# Patient Record
Sex: Female | Born: 1955 | Race: Black or African American | Hispanic: No | Marital: Married | Smoking: Never smoker
Health system: Southern US, Community
[De-identification: ages and names within clinical notes are randomized; demographics above are authoritative.]

## PROBLEM LIST (undated history)

## (undated) DIAGNOSIS — I10 Essential (primary) hypertension: Secondary | ICD-10-CM

---

## 2021-10-09 ENCOUNTER — Other Ambulatory Visit: Payer: Self-pay

## 2021-10-09 ENCOUNTER — Emergency Department
Admission: EM | Admit: 2021-10-09 | Discharge: 2021-10-09 | Disposition: A | Discharge status: Home / Self Care | Payer: BC Managed Care – PPO | Attending: Emergency Medicine | Admitting: Emergency Medicine

## 2021-10-09 ENCOUNTER — Emergency Department: Payer: BC Managed Care – PPO

## 2021-10-09 DIAGNOSIS — Z79899 Other long term (current) drug therapy: Secondary | ICD-10-CM | POA: Diagnosis not present

## 2021-10-09 DIAGNOSIS — I10 Essential (primary) hypertension: Secondary | ICD-10-CM | POA: Diagnosis present

## 2021-10-09 HISTORY — DX: Essential (primary) hypertension: I10

## 2021-10-09 LAB — CBC
HCT: 44.9 % (ref 36.0–46.0)
Hemoglobin: 15 g/dL (ref 12.0–15.0)
MCH: 30.2 pg (ref 26.0–34.0)
MCHC: 33.4 g/dL (ref 30.0–36.0)
MCV: 90.3 fL (ref 80.0–100.0)
Platelets: 196 10*3/uL (ref 150–400)
RBC: 4.97 MIL/uL (ref 3.87–5.11)
RDW: 12.3 % (ref 11.5–15.5)
WBC: 3.9 10*3/uL — ABNORMAL LOW (ref 4.0–10.5)
nRBC: 0 % (ref 0.0–0.2)

## 2021-10-09 LAB — URINALYSIS, ROUTINE W REFLEX MICROSCOPIC
Bilirubin Urine: NEGATIVE
Glucose, UA: NEGATIVE mg/dL
Hgb urine dipstick: NEGATIVE
Ketones, ur: NEGATIVE mg/dL
Leukocytes,Ua: NEGATIVE
Nitrite: NEGATIVE
Protein, ur: NEGATIVE mg/dL
Specific Gravity, Urine: 1.01 (ref 1.005–1.030)
pH: 8 (ref 5.0–8.0)

## 2021-10-09 LAB — BASIC METABOLIC PANEL
Anion gap: 6 (ref 5–15)
BUN: 9 mg/dL (ref 8–23)
CO2: 29 mmol/L (ref 22–32)
Calcium: 10.3 mg/dL (ref 8.9–10.3)
Chloride: 106 mmol/L (ref 98–111)
Creatinine, Ser: 0.79 mg/dL (ref 0.44–1.00)
GFR, Estimated: >60 mL/min (ref >60)
Glucose, Bld: 100 mg/dL — ABNORMAL HIGH (ref 70–99)
Potassium: 3.7 mmol/L (ref 3.5–5.1)
Sodium: 141 mmol/L (ref 135–145)

## 2021-10-09 LAB — BRAIN NATRIURETIC PEPTIDE: B Natriuretic Peptide: 63.8 pg/mL (ref 0.0–100.0)

## 2021-10-09 NOTE — Discharge Instructions (Addendum)
-  Please follow-up with your primary care provider as discussed.  -Return to the emergency department anytime if you begin to experience any new or worsening symptoms. 

## 2021-10-09 NOTE — ED Triage Notes (Signed)
Pt presents to ER from home c/o progressively increasing HTN.  Pt states today her BP was 181/100 and is c/o HA.  Pt states she has hx of HTN and takes losartan and lopressor for BP and has been compliant.  Pt denies chest pain or sob.  Pt is A&O x4 at this time in NAD.   ?

## 2021-10-09 NOTE — ED Provider Notes (Signed)
? ?[facility] ?Provider Note ? ? ? Event Date/Time  ? First MD Initiated Contact with Patient 10/09/21 1933   ?  (approximate) ? ? ?History  ? ?Chief Complaint ?Hypertension ? ? ?HPI ?Emily Buchanan is a 66 y.o. female, history of hypertension, presents to the emergency department for evaluation of high blood pressure.  Patient states that her blood pressure today was 181/100.  She states that it is progressively been increasing over the past week or so.  She is unsure what her baseline is.  She states that she takes losartan and metoprolol for her blood pressure.  Endorses mild headaches as well.  Denies fever/chills, chest pain, shortness of breath, abdominal pain, flank pain, nausea/vomiting, diarrhea, urinary symptoms, vision changes, hearing changes or dizziness/lightheadedness ? ?History Limitations: No limitations ? ?    ? ? ?Physical Exam  ?Triage Vital Signs: ?ED Triage Vitals  ?Enc Vitals Group  ?   BP 10/09/21 1912 (!) 196/104  ?   Pulse Rate 10/09/21 1912 64  ?   Resp 10/09/21 1912 20  ?   Temp 10/09/21 1912 97.9 ?F (36.6 ?C)  ?   Temp Source 10/09/21 1912 Oral  ?   SpO2 10/09/21 1912 98 %  ?   Weight 10/09/21 1913 235 lb (106.6 kg)  ?   Height 10/09/21 1913 5\' 6"  (1.676 m)  ?   Head Circumference --   ?   Peak Flow --   ?   Pain Score 10/09/21 1913 3  ?   Pain Loc --   ?   Pain Edu? --   ?   Excl. in [place]? --   ? ? ?Most recent vital signs: ?Vitals:  ? 10/09/21 1912 10/09/21 2209  ?BP: (!) 196/104 (!) 190/98  ?Pulse: 64 70  ?Resp: 20 18  ?Temp: 97.9 ?F (36.6 ?C) 98.5 ?F (36.9 ?C)  ?SpO2: 98% 99%  ? ? ?General: Awake, NAD.  ?Skin: Warm, dry. No rashes or lesions.  ?Eyes: PERRL. Conjunctivae normal.  ?CV: Good peripheral perfusion.  ?Resp: Normal effort.  Lung sounds are clear bilaterally in the apices and bases. ?Abd: Soft, non-tender. No distention.  ?Neuro: At baseline. No gross neurological deficits.  ? ?Focused Exam: Not applicable. ? ?Physical Exam ? ? ? ?ED Results /  Procedures / Treatments  ?Labs ?(all labs ordered are listed, but only abnormal results are displayed) ?Labs Reviewed  ?CBC - Abnormal; Notable for the following components:  ?    Result Value  ? WBC 3.9 (*)   ? All other components within normal limits  ?BASIC METABOLIC PANEL - Abnormal; Notable for the following components:  ? Glucose, Bld 100 (*)   ? All other components within normal limits  ?URINALYSIS, ROUTINE W REFLEX MICROSCOPIC - Abnormal; Notable for the following components:  ? Color, Urine YELLOW (*)   ? APPearance CLEAR (*)   ? All other components within normal limits  ?BRAIN NATRIURETIC PEPTIDE  ? ? ? ?EKG ?Sinus rhythm, rate of 66, no ST segment changes, no axis deviations, no AV blocks, normal QRS interval. ? ? ?RADIOLOGY ? ?ED Provider Interpretation: I personally reviewed this chest x-ray, no evidence of significant pulmonary edema based on my interpretation. ? ?DG Chest 2 View ? ?Result Date: 10/09/2021 ?CLINICAL DATA:  sob EXAM: CHEST - 2 VIEW COMPARISON:  None. FINDINGS: Enlarged cardiac silhouette. Tortuous aorta. Pulmonary vascular congestion. No consolidation. No visible pleural effusions or pneumothorax. Left shoulder degenerative change. No acute fracture. IMPRESSION: Cardiomegaly  and pulmonary vascular congestion without overt pulmonary edema. Electronically Signed   By: Feliberto Harts M.D.   On: 10/09/2021 20:34   ? ?PROCEDURES: ? ?Critical Care performed: None. ? ?Procedures ? ? ? ?MEDICATIONS ORDERED IN ED: ?Medications - No data to display ? ? ?IMPRESSION / MDM / ASSESSMENT AND PLAN / ED COURSE  ?I reviewed the triage vital signs and the nursing notes. ?             ?               ? ?Differential diagnosis includes, but is not limited to, hypertension, hypertensive encephalopathy, hypertensive emergency. ? ?ED Course ?Patient appears well.  Blood pressure currently 190/98, otherwise normal vitals.  NAD.  Currently asymptomatic. ? ?CBC shows no leukocytosis or anemia.  BMP shows no  evidence of electrolyte abnormalities or elevated creatinine. ? ?BMP unremarkable at 60.8. ? ?Urinalysis shows no proteinuria or evidence of infection. ? ?Assessment/Plan ?Patient presents with asymptomatic hypertension.  Although patient does endorse mild intermittent headaches, she does not appear to be encephalopathic.  No evidence of end-organ dysfunction.  Chest x-ray and lab work-up reassuring.  Patient states that she has a appointment with her primary care provider within a week.  Advised her that they would likely need to increase her dosage or add another antihypertensive agent to control her blood pressure.  No emergent need for starting her on any additional agents at this time.  We will plan to discharge. ? ?Considered admission for this patient, but given her stable presentation and unremarkable work-up, she is unlikely to benefit. ? ?Provided the patient with anticipatory guidance, return precautions, and educational material. Encouraged the patient to return to the emergency department at any time if they begin to experience any new or worsening symptoms. Patient expressed understanding and agreed with the plan.  ? ?  ? ? ?FINAL CLINICAL IMPRESSION(S) / ED DIAGNOSES  ? ?Final diagnoses:  ?Hypertension, unspecified type  ? ? ? ?Rx / DC Orders  ? ?ED Discharge Orders   ? ? None  ? ?  ? ? ? ?Note:  This document was prepared using Dragon voice recognition software and may include unintentional dictation errors. ?  ?Varney Daily, [place] ?10/10/21 0002 ? ?  ?Jene Every, MD ?10/11/21 [phone] ? ?

## 2022-09-19 ENCOUNTER — Ambulatory Visit
Admission: EM | Admit: 2022-09-19 | Discharge: 2022-09-19 | Disposition: A | Discharge status: Home / Self Care | Payer: BC Managed Care – PPO

## 2022-09-19 DIAGNOSIS — R21 Rash and other nonspecific skin eruption: Secondary | ICD-10-CM | POA: Diagnosis not present

## 2022-09-19 NOTE — ED Provider Notes (Signed)
[facility]    CSN: [id number] Arrival date & time: 09/19/22  1010      History   Chief Complaint Chief Complaint  Patient presents with   Rash    HPI Emily Buchanan is a 67 y.o. female.    Rash   Presents to urgent care with complaint of rash on bilateral arms x 1 week.  Denies use of new products, creams, perfumes, clothing, detergents, medications.  Patient has elevated blood pressure in clinic.  She states she did not take her blood pressure medicines.  Past Medical History:  Diagnosis Date   Hypertension     There are no problems to display for this patient.   History reviewed. No pertinent surgical history.  OB History   No obstetric history on file.      Home Medications    Prior to Admission medications   Medication Sig Start Date End Date Taking? Authorizing Provider  losartan (COZAAR) 100 MG tablet Take 100 mg by mouth daily.    [provider]  metoprolol tartrate (LOPRESSOR) 50 MG tablet Take 50 mg by mouth 2 (two) times daily.    [provider]  simvastatin (ZOCOR) 40 MG tablet Take 40 mg by mouth at bedtime.    [provider]    Family History History reviewed. No pertinent family history.  Social History Social History   Tobacco Use   Smoking status: Never   Smokeless tobacco: Never  Vaping Use   Vaping Use: Never used  Substance Use Topics   Alcohol use: Never   Drug use: Never     Allergies   Patient has no known allergies.   Review of Systems Review of Systems  Skin:  Positive for rash.     Physical Exam Triage Vital Signs ED Triage Vitals  Enc Vitals Group     BP 09/19/22 1110 (S) (!) 172/77     Pulse Rate 09/19/22 1110 70     Resp 09/19/22 1110 16     Temp 09/19/22 1110 98 F (36.7 C)     Temp Source 09/19/22 1110 Temporal     SpO2 09/19/22 1110 98 %     Weight --      Height --      Head Circumference --      Peak Flow --      Pain Score 09/19/22 1109 5      Pain Loc --      Pain Edu? --      Excl. in [place]? --    No data found.  Updated Vital Signs BP (S) (!) 172/77 Comment: did not take her BP meds  Pulse 70   Temp 98 F (36.7 C) (Temporal)   Resp 16   SpO2 98%   Visual Acuity Right Eye Distance:   Left Eye Distance:   Bilateral Distance:    Right Eye Near:   Left Eye Near:    Bilateral Near:     Physical Exam Vitals reviewed.  Constitutional:      Appearance: Normal appearance.  HENT:     Head: Normocephalic and atraumatic.  Skin:    General: Skin is warm and dry.     Findings: Rash present. Rash is papular.       Neurological:     General: No focal deficit present.     Mental Status: She is alert and oriented to person, place, and time.  Psychiatric:        Mood and Affect:  Mood normal.        Behavior: Behavior normal.      UC Treatments / Results  Labs (all labs ordered are listed, but only abnormal results are displayed) Labs Reviewed - No data to display  EKG   Radiology No results found.  Procedures Procedures (including critical care time)  Medications Ordered in UC Medications - No data to display  Initial Impression / Assessment and Plan / UC Course  I have reviewed the triage vital signs and the nursing notes.  Pertinent labs & imaging results that were available during my care of the patient were reviewed by me and considered in my medical decision making (see chart for details).   Recommending conservative treatment for new papular rash.  Suggested use of antihistamines, nonsedating such as Zyrtec or Claritin.  She could use Benadryl at nighttime however warned her of the side effect of sedation and increased risk of falls.  Also suggested use of low potency steroid cream such as hydrocortisone which is available over-the-counter.  Patient acknowledges understanding and agreement with this treatment plan.   Final Clinical Impressions(s) / UC Diagnoses   Final diagnoses:  None    Discharge Instructions   None    ED Prescriptions   None    PDMP not reviewed this encounter.   Rose Phi,  09/19/22 1129

## 2022-09-19 NOTE — Discharge Instructions (Signed)
Follow up here or with your primary care provider if your symptoms are worsening or not improving.     

## 2022-09-19 NOTE — ED Triage Notes (Signed)
Patient presents to UC for rash on bilateral arms x 1 week. Treating rash with aveno cream.   Denies any changes or fever.

## 2022-11-07 ENCOUNTER — Encounter: Payer: Self-pay | Admitting: Emergency Medicine

## 2022-11-07 ENCOUNTER — Ambulatory Visit
Admission: EM | Admit: 2022-11-07 | Discharge: 2022-11-07 | Disposition: A | Discharge status: Home / Self Care | Payer: BC Managed Care – PPO | Attending: Emergency Medicine | Admitting: Emergency Medicine

## 2022-11-07 DIAGNOSIS — R21 Rash and other nonspecific skin eruption: Secondary | ICD-10-CM

## 2022-11-07 DIAGNOSIS — I10 Essential (primary) hypertension: Secondary | ICD-10-CM | POA: Diagnosis not present

## 2022-11-07 MED ORDER — PREDNISONE 10 MG (21) PO TBPK: ORAL_TABLET | Freq: Every day | ORAL | 0 refills | Status: AC

## 2022-11-07 MED ORDER — TRIAMCINOLONE ACETONIDE 0.1 % EX CREA: 1.0000 | TOPICAL_CREAM | Freq: Two times a day (BID) | CUTANEOUS | 0 refills | Status: AC

## 2022-11-07 NOTE — ED Provider Notes (Signed)
[facility]    CSN: [id number] Arrival date & time: 11/07/22  1438      History   Chief Complaint Chief Complaint  Patient presents with   Rash    HPI Emily Buchanan is a 67 y.o. female.  Patient presents with several weeks history of pruritic rash on her forearms, primarily on the right side; worse x 2 weeks.  No new products, medications, foods.  No recent travel.  No difficulty swallowing or breathing.  No fever, sore throat, cough, or other symptoms.  Treatment attempted with oral Benadryl and hydrocortisone cream.  Patient was seen at this urgent care on 09/19/2022 for rash on her forearms; treated symptomatically.  She was seen by her PCP on 10/05/2022 and reported the rash was improving at that time.  Her medical history includes HTN.   The history is provided by the patient and medical records.    Past Medical History:  Diagnosis Date   Hypertension     There are no problems to display for this patient.   History reviewed. No pertinent surgical history.  OB History   No obstetric history on file.      Home Medications    Prior to Admission medications   Medication Sig Start Date End Date Taking? Authorizing Provider  predniSONE (STERAPRED UNI-PAK 21 TAB) 10 MG (21) TBPK tablet Take by mouth daily. As directed 11/07/22  Yes Mickie Bail, NP  triamcinolone cream (KENALOG) 0.1 % Apply 1 Application topically 2 (two) times daily. 11/07/22  Yes Mickie Bail, NP  losartan (COZAAR) 100 MG tablet Take 100 mg by mouth daily.    [provider]  metoprolol tartrate (LOPRESSOR) 50 MG tablet Take 50 mg by mouth 2 (two) times daily.    [provider]  simvastatin (ZOCOR) 40 MG tablet Take 40 mg by mouth at bedtime.    [provider]    Family History No family history on file.  Social History Social History   Tobacco Use   Smoking status: Never   Smokeless tobacco: Never  Vaping Use   Vaping Use: Never used  Substance  Use Topics   Alcohol use: Never   Drug use: Never     Allergies   Patient has no known allergies.   Review of Systems Review of Systems  Constitutional:  Negative for chills and fever.  HENT:  Negative for ear pain and sore throat.   Respiratory:  Negative for cough and shortness of breath.   Cardiovascular:  Negative for chest pain and palpitations.  Musculoskeletal:  Negative for arthralgias and joint swelling.  Skin:  Positive for rash. Negative for color change.     Physical Exam Triage Vital Signs ED Triage Vitals  Enc Vitals Group     BP 11/07/22 1451 (!) 163/92     Pulse Rate 11/07/22 1451 (!) 55     Resp 11/07/22 1451 16     Temp 11/07/22 1451 97.6 F (36.4 C)     Temp Source 11/07/22 1451 Oral     SpO2 11/07/22 1451 97 %     Weight 11/07/22 1449 235 lb (106.6 kg)     Height 11/07/22 1449 5\' 5"  (1.651 m)     Head Circumference --      Peak Flow --      Pain Score --      Pain Loc --      Pain Edu? --      Excl. in [place]? --  No data found.  Updated Vital Signs BP (!) 163/92 (BP Location: Right Arm)   Pulse (!) 55   Temp 97.6 F (36.4 C) (Oral)   Resp 16   Ht 5\' 5"  (1.651 m)   Wt 235 lb (106.6 kg)   SpO2 97%   BMI 39.11 kg/m   Visual Acuity Right Eye Distance:   Left Eye Distance:   Bilateral Distance:    Right Eye Near:   Left Eye Near:    Bilateral Near:     Physical Exam Constitutional:      General: She is not in acute distress.    Appearance: Normal appearance.  HENT:     Mouth/Throat:     Mouth: Mucous membranes are moist.     Pharynx: Oropharynx is clear.  Cardiovascular:     Rate and Rhythm: Normal rate and regular rhythm.     Heart sounds: Normal heart sounds.  Pulmonary:     Effort: Pulmonary effort is normal. No respiratory distress.     Breath sounds: Normal breath sounds.  Skin:    General: Skin is warm and dry.     Findings: Rash present.     Comments: Diffuse flesh-colored maculopapular rash primarily on the right  forearm but some on the left forearm as well.  No open wounds or drainage.   Neurological:     Mental Status: She is alert.  Psychiatric:        Mood and Affect: Mood normal.        Behavior: Behavior normal.      UC Treatments / Results  Labs (all labs ordered are listed, but only abnormal results are displayed) Labs Reviewed - No data to display  EKG   Radiology No results found.  Procedures Procedures (including critical care time)  Medications Ordered in UC Medications - No data to display  Initial Impression / Assessment and Plan / UC Course  I have reviewed the triage vital signs and the nursing notes.  Pertinent labs & imaging results that were available during my care of the patient were reviewed by me and considered in my medical decision making (see chart for details).   Rash.  Elevated blood pressure reading with hypertension.  Treating with prednisone taper, Zyrtec, triamcinolone cream.  Education provided on rash.  Instructed patient to follow-up with her PCP or dermatologist if her rash is not improving. Also discussed with patient that her blood pressure is elevated today and needs to be rechecked by PCP in 2 to 4 weeks.  Education provided on managing hypertension.  She agrees to plan of care.    Final Clinical Impressions(s) / UC Diagnoses   Final diagnoses:  Rash  Elevated blood pressure reading in office with diagnosis of hypertension     Discharge Instructions      Use the steroid cream as directed.  Take the prednisone taper as directed.  Take Zyrtec as directed.  Follow up with your primary care provider or dermatologist if you are not improving.   Your blood pressure is elevated today at 163/92.  Please have this rechecked by your primary care provider in 2-4 weeks.          ED Prescriptions     Medication Sig Dispense Auth. Provider   predniSONE (STERAPRED UNI-PAK 21 TAB) 10 MG (21) TBPK tablet Take by mouth daily. As directed 21 tablet  Mickie Bail, NP   triamcinolone cream (KENALOG) 0.1 % Apply 1 Application topically 2 (two) times daily. 30  g Mickie Bail, NP      PDMP not reviewed this encounter.   Mickie Bail, NP 11/07/22 [phone]

## 2022-11-07 NOTE — Discharge Instructions (Addendum)
Use the steroid cream as directed.  Take the prednisone taper as directed.  Take Zyrtec as directed.  Follow up with your primary care provider or dermatologist if you are not improving.   Your blood pressure is elevated today at 163/92.  Please have this rechecked by your primary care provider in 2-4 weeks.

## 2022-11-07 NOTE — ED Triage Notes (Addendum)
Patient in office today c/o right and left arms rash itch a lot special the right arm worser. 2wks  OTC: benadryl and cortisone cream  Denies: fever, n/v

## 2024-04-03 IMAGING — CR DG CHEST 2V
2 series · 2 of 2 positions shown · non-contrast
Comparison: None.

CLINICAL DATA: sob

EXAM:
CHEST - 2 VIEW

[chest pa]
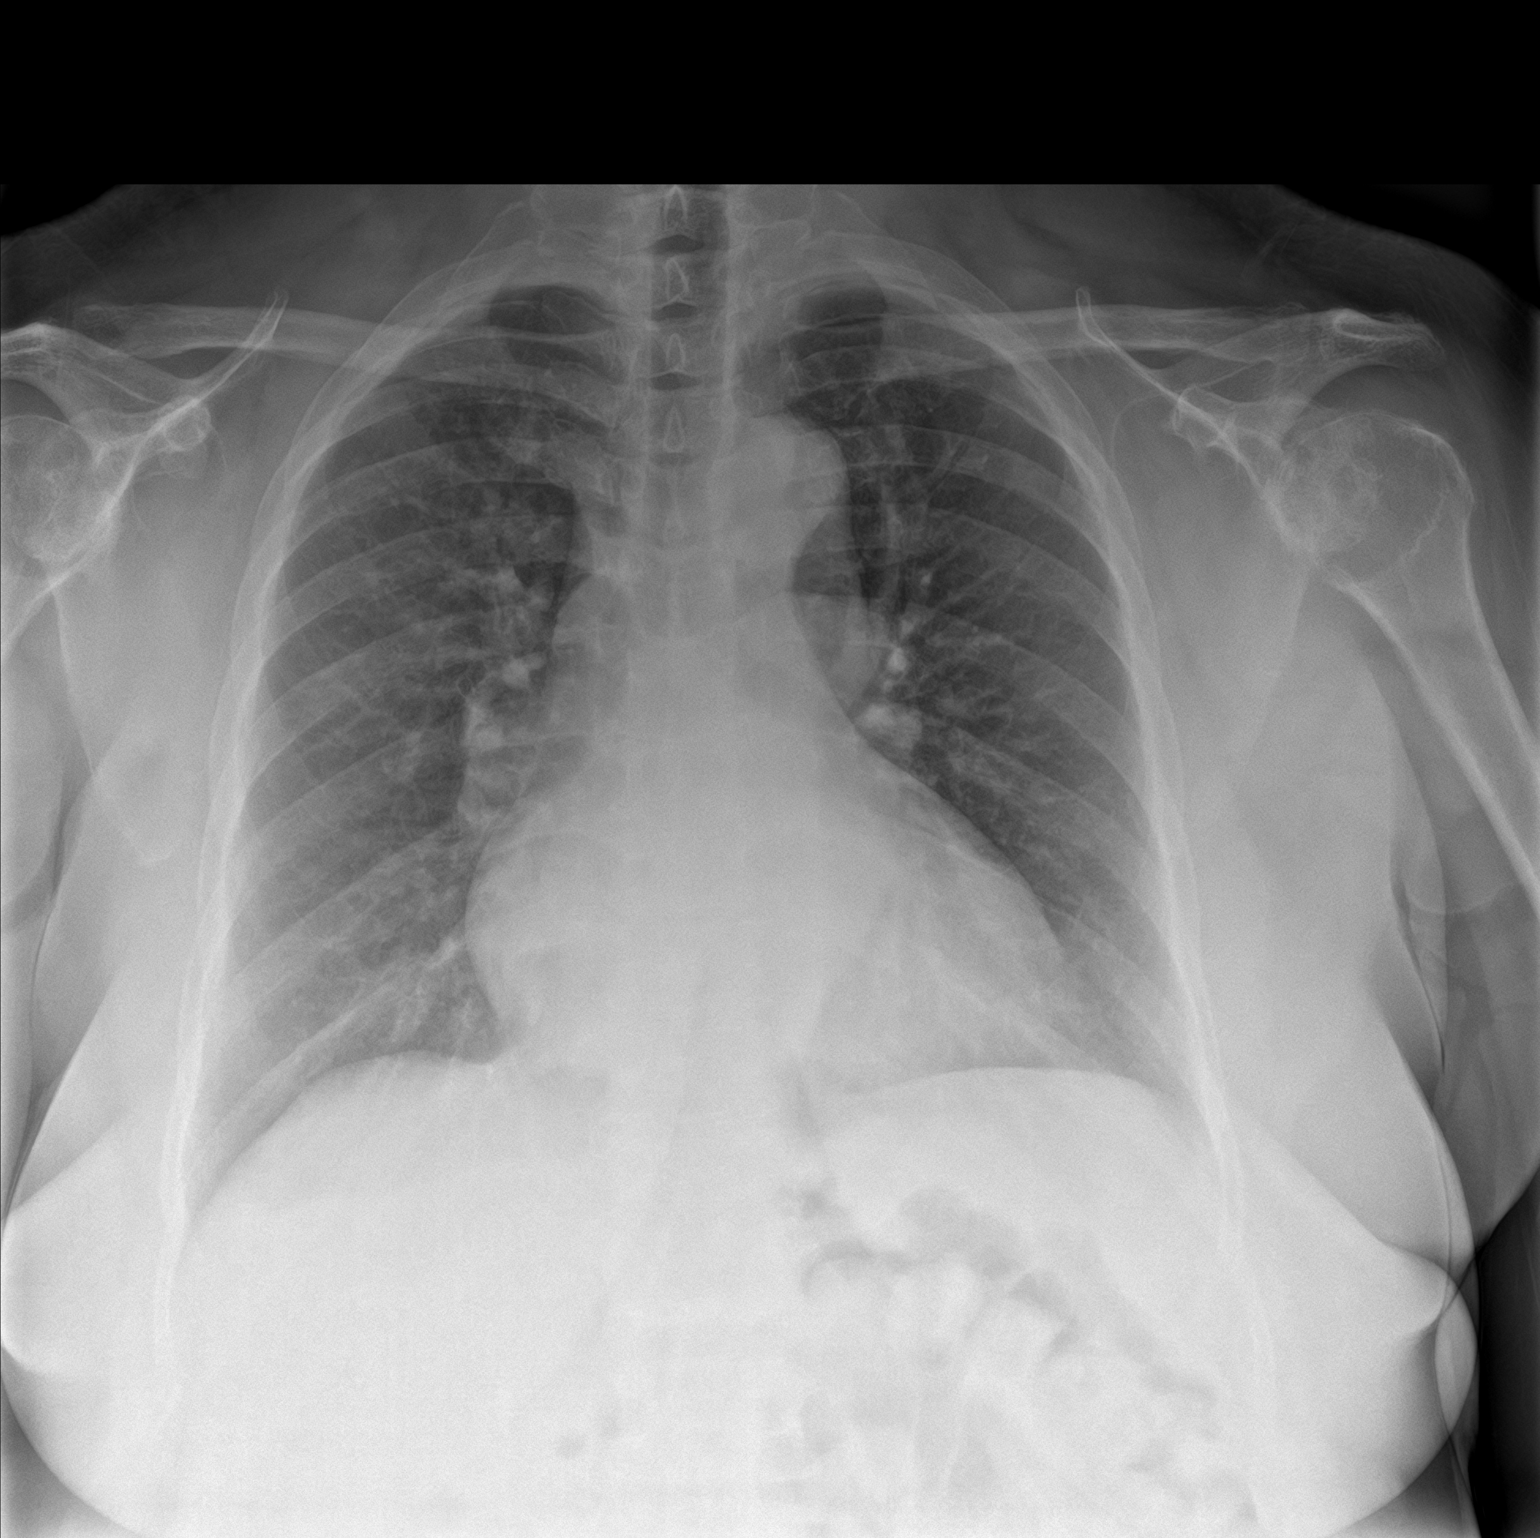

[chest lat]
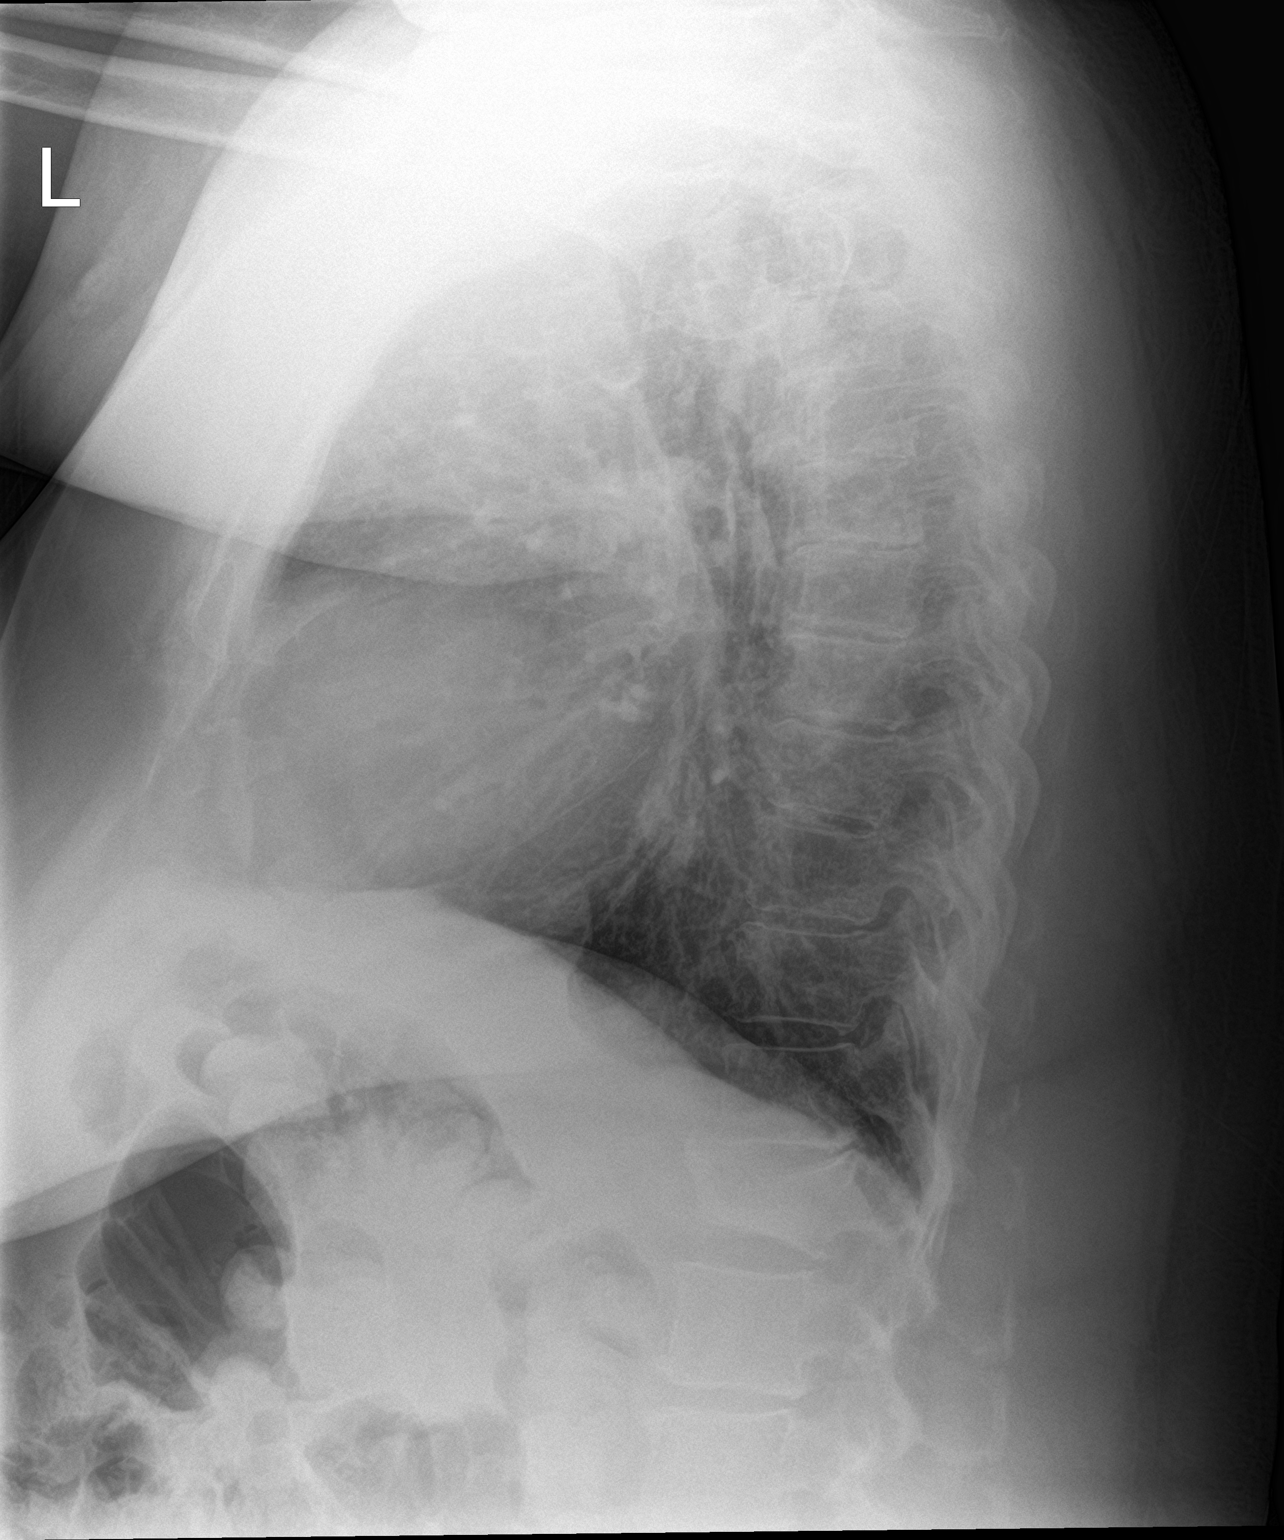

[2 of 2 positions shown; findings below may reference images not displayed]

FINDINGS: Enlarged cardiac silhouette. Tortuous aorta. Pulmonary vascular
congestion. No consolidation. No visible pleural effusions or
pneumothorax. Left shoulder degenerative change. No acute fracture.
IMPRESSION: Cardiomegaly and pulmonary vascular congestion without overt
pulmonary edema.
